# Patient Record
Sex: Female | Born: 1970 | Race: White | Hispanic: No | Marital: Married
Health system: Southern US, Community
[De-identification: ages and names within clinical notes are randomized; demographics above are authoritative.]

## PROBLEM LIST (undated history)

## (undated) DIAGNOSIS — E785 Hyperlipidemia, unspecified: Secondary | ICD-10-CM

## (undated) DIAGNOSIS — I10 Essential (primary) hypertension: Secondary | ICD-10-CM

## (undated) DIAGNOSIS — E119 Type 2 diabetes mellitus without complications: Secondary | ICD-10-CM

## (undated) HISTORY — DX: Type 2 diabetes mellitus without complications: E11.9

## (undated) HISTORY — DX: Hyperlipidemia, unspecified: E78.5

## (undated) HISTORY — PX: RHINOPLASTY: SUR1284

## (undated) HISTORY — DX: Essential (primary) hypertension: I10

## (undated) HISTORY — PX: APPENDECTOMY: SHX54

## (undated) HISTORY — PX: TUBAL LIGATION: SHX77

---

## 1999-09-15 ENCOUNTER — Encounter: Payer: Self-pay | Admitting: Emergency Medicine

## 1999-09-15 ENCOUNTER — Emergency Department (HOSPITAL_COMMUNITY): Admission: EM | Admit: 1999-09-15 | Discharge: 1999-09-15 | Payer: Self-pay | Admitting: Emergency Medicine

## 2001-08-23 ENCOUNTER — Encounter: Payer: Self-pay | Admitting: Family Medicine

## 2001-08-23 ENCOUNTER — Encounter: Admission: RE | Admit: 2001-08-23 | Discharge: 2001-08-23 | Payer: Self-pay | Admitting: Family Medicine

## 2001-11-22 ENCOUNTER — Encounter: Payer: Self-pay | Admitting: Family Medicine

## 2001-11-22 ENCOUNTER — Ambulatory Visit (HOSPITAL_COMMUNITY): Admission: RE | Admit: 2001-11-22 | Discharge: 2001-11-22 | Payer: Self-pay | Admitting: Family Medicine

## 2008-09-23 ENCOUNTER — Encounter (INDEPENDENT_AMBULATORY_CARE_PROVIDER_SITE_OTHER): Payer: Self-pay | Admitting: General Surgery

## 2008-09-23 ENCOUNTER — Ambulatory Visit (HOSPITAL_COMMUNITY): Admission: EM | Admit: 2008-09-23 | Discharge: 2008-09-24 | Payer: Self-pay | Admitting: Emergency Medicine

## 2011-03-30 NOTE — Op Note (Signed)
NAMEJAYME, MEDNICK            ACCOUNT NO.:  [id number]   MEDICAL RECORD NO.:  [id number]          PATIENT TYPE:  INP   LOCATION:  0098                         FACILITY:  [facility]   PHYSICIAN:  Almond Lint, MD       DATE OF BIRTH:  [birth year]   DATE OF PROCEDURE:  09/23/2008  DATE OF DISCHARGE:                               OPERATIVE REPORT   PREOPERATIVE DIAGNOSIS:  Appendicitis.   POSTOPERATIVE DIAGNOSIS:  Appendicitis.   PROCEDURE PERFORMED:  Laparoscopic appendectomy.   SURGEON:  Almond Lint, MD.   ANESTHESIA:  General and local.   FINDINGS:  Acute suppurative appendicitis, and a large uterine fibroid  that was pedunculated off the pelvis and uterus.   SPECIMENS:  Appendix to Pathology.   ESTIMATED BLOOD LOSS:  Less than 25 mL.   COMPLICATIONS:  None known.   PROCEDURE:  Ms. Saxby was identified in the holding area and taken to  the operating room where she was placed supine on the operating room  table.  General endotracheal anesthesia was induced.  Her left arm was  tucked, a Foley was placed, and her abdomen was prepped and draped in a  sterile fashion.  A timeout was performed according to the Surgical  Safety Check List, and when all was correct we commenced to the  procedure.   The supraumbilical skin was anesthetized with local anesthesia and a  midline incision was made just below the umbilicus approximately 1.2 cm  in length.  A Tresa Endo was used to spread the subcutaneous tissue, and  Kocher clamps were used to elevate the fascia on both sides of the  midline.  The midline fascia was entered with an 11 blade as well, and a  Kelly used to confirm entrance into the abdominal cavity.  A 0 Vicryl  pursestring suture was placed around the fascial incision.  The [employer]  trocar was introduced into the abdomen and a pneumoperitoneum was  achieved to a pressure of 15 mmHg.  The 10-mm camera was advanced into  the abdomen, and the patient was placed into  Trendelenburg and rotated  to the left.   The appendix was seen just lateral to the cecum and did indeed appear to  be inflamed.  A port was placed in the left lower quadrant after  administration of local anesthesia under direct visualization.  An  additional port was placed in the right upper quadrant under direct  visualization after administration of local anesthesia.  The appendix  was grasped with a locking grasper and elevated, and an harmonic scalpel  was used to divide the attachments to the retroperitoneum as well as the  mesoappendix.  Once this was completely dissected down and the base of  the appendix was skeletonized, the 10-mm scope was switched out to a 5-  mm scope, and the Endo-GIA was clamped about the base of the appendix.  This was done with good visualization of both tips, taking care not to  injure the small bowel or the cecum.  This was fired and held for 12  seconds and then transected.  The appendix was then placed  into an  EndoCatch bag and removed from the abdomen.   We switched back to the 10-mm scope, and the right lower quadrant and  pelvis were irrigated with the suction irrigator.  There was no bleeding  seen and no purulent drainage.  There was no sign of any damage to the  cecum or the terminal ileum.  The uterus was noted to have a very large  pedunculated fibroid, and a picture was taken of it.  The tubes and the  ovaries appeared normal, and pictures were taken of these as well.  The  pneumoperitoneum was then allowed to evacuate, and the 0 Vicryl  pursestring suture was closed.  This was palpated and there were no  palpable fascial defects.  The skin was closed using 4-0 Monocryl in a  subcuticular fashion, and the skin was cleaned, dried, and dressed with  Dermabond.  The patient was awakened from anesthesia and taken to the  PACU in stable condition.  Needle and sponge counts were correct times  two.      Almond Lint, MD  Electronically  Signed     FB/MEDQ  D:  09/23/2008  T:  09/23/2008  Job:  [id number]

## 2011-03-30 NOTE — H&P (Signed)
[name], Morgan Sims            ACCOUNT NO.:  [id number]   MEDICAL RECORD NO.:  [id number]          PATIENT TYPE:  EMS   LOCATION:  ED                           FACILITY:  [facility]   PHYSICIAN:  Almond Lint, MD       DATE OF BIRTH:  01-26-1971   DATE OF ADMISSION:  09/23/2008  DATE OF DISCHARGE:                              HISTORY & PHYSICAL   REFERRING PHYSICIAN:  Robert A. Nicholos Johns, M.D. of [facility] .   CHIEF COMPLAINT:  Abdominal pain.   HISTORY OF PRESENT ILLNESS:  [name] is a 40 year old female who  around 4 days ago began having abdominal discomfort.  She also developed  diarrhea and nausea and a decreased appetite.  This did not improve, and  this morning she had pain that awoke her from sleep at 4 a.m.  This is  now principally on the right side.  She has described a feeling of  clamminess but no fever.   PAST MEDICAL HISTORY:  1. Bipolar disorder.  2. History of asthma, for which she uses an inhaler approximately one      time a year.   PAST SURGICAL HISTORY:  1. Tonsillectomy and adenoidectomy.  2. Tubal ligation, laparoscopically.  3. Rhinoplasty.   FAMILY HISTORY:  CVA in her mother and a family history of hypertension.   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  Smokes five cigarettes a day.  Does not drink alcohol.   MEDICATIONS:  1. Lithium.  2. Seroquel.  3. Wellbutrin.  4. Lamictal.  5. Prozac.   ALLERGIES:  KEFLEX causes her face to swell.   PHYSICAL EXAMINATION:  Temperature 98.5, pulse 71, respiratory rate 18,  blood pressure 125/91.  Alert and oriented x3.  No acute distress.  HEENT:  Normocephalic and atraumatic.  Sclerae are anicteric.  Pupils  are equal and reactive to light.  NECK:  Supple.  HEART:  Regular.  LUNGS:  Clear.  ABDOMEN:  Soft, nondistended.  Very tender in the right lower quadrant.  Positive Rovsing's sign.  EXTREMITIES:  Warm and well perfused.  No edema.  SKIN:  No rashes  seen.  PSYCH:  Normal mood and affect.   LABS:  White count is 11.4, hematocrit 38.9, platelet count 318,000.   CT scan demonstrates acute appendicitis.  This is discussed directly  with Dr. Clovis Riley at [facility].  There are no other  findings present.   ASSESSMENT:  Acute appendicitis.   PLAN:  Laparoscopic appendectomy.  IV fluids.  N.P.O.  IV antibiotics.  The patient was advised of the risks and benefits for surgery.  We  discussed bleeding, infection, damage to other structures, risks of  conversion to open, and risks of having a ruptured appendix which would  require a longer inpatient stay, and higher risk of intraabdominal  abscess.  She agreed and wishes to proceed.      Almond Lint, MD  Electronically Signed     FB/MEDQ  D:  09/23/2008  T:  09/23/2008  Job:  [id number]

## 2011-08-17 LAB — DIFFERENTIAL
Basophils Absolute: 0
Basophils Relative: 0
Eosinophils Absolute: 0
Eosinophils Relative: 0
Lymphocytes Relative: 11 — ABNORMAL LOW
Lymphs Abs: 1.6
Monocytes Absolute: 0.8
Monocytes Relative: 5
Neutro Abs: 12.2 — ABNORMAL HIGH
Neutrophils Relative %: 83 — ABNORMAL HIGH

## 2011-08-17 LAB — URINALYSIS, ROUTINE W REFLEX MICROSCOPIC
Ketones, ur: NEGATIVE
Nitrite: NEGATIVE
Protein, ur: NEGATIVE
Urobilinogen, UA: 0.2

## 2011-08-17 LAB — BASIC METABOLIC PANEL
BUN: 8
CO2: 25
Calcium: 9.7
Chloride: 107
Creatinine, Ser: 0.76
GFR calc Af Amer: >60
GFR calc non Af Amer: >60
Glucose, Bld: 133 — ABNORMAL HIGH
Potassium: 3.7
Sodium: 140

## 2011-08-17 LAB — CBC
HCT: 39.7
Hemoglobin: 13.5
MCHC: 34
MCV: 85
Platelets: 361
RBC: 4.67
RDW: 14.1
WBC: 14.6 — ABNORMAL HIGH

## 2012-10-26 ENCOUNTER — Other Ambulatory Visit: Payer: Self-pay | Admitting: Family Medicine

## 2012-10-26 DIAGNOSIS — R1011 Right upper quadrant pain: Secondary | ICD-10-CM

## 2012-10-27 ENCOUNTER — Ambulatory Visit
Admission: RE | Admit: 2012-10-27 | Discharge: 2012-10-27 | Disposition: A | Discharge status: Home / Self Care | Payer: BC Managed Care – PPO | Source: Ambulatory Visit | Attending: Family Medicine | Admitting: Family Medicine

## 2012-10-27 DIAGNOSIS — R1011 Right upper quadrant pain: Secondary | ICD-10-CM

## 2012-10-31 ENCOUNTER — Other Ambulatory Visit (HOSPITAL_COMMUNITY): Payer: Self-pay | Admitting: Family Medicine

## 2012-10-31 DIAGNOSIS — R109 Unspecified abdominal pain: Secondary | ICD-10-CM

## 2012-10-31 DIAGNOSIS — R1011 Right upper quadrant pain: Secondary | ICD-10-CM

## 2012-11-16 ENCOUNTER — Encounter (HOSPITAL_COMMUNITY): Payer: BC Managed Care – PPO

## 2015-06-17 ENCOUNTER — Other Ambulatory Visit: Payer: Self-pay

## 2015-06-17 DIAGNOSIS — Z1231 Encounter for screening mammogram for malignant neoplasm of breast: Secondary | ICD-10-CM

## 2015-06-18 ENCOUNTER — Ambulatory Visit
Admission: RE | Admit: 2015-06-18 | Discharge: 2015-06-18 | Disposition: A | Discharge status: Home / Self Care | Payer: BC Managed Care – PPO | Source: Ambulatory Visit

## 2015-06-18 DIAGNOSIS — Z1231 Encounter for screening mammogram for malignant neoplasm of breast: Secondary | ICD-10-CM

## 2015-06-19 ENCOUNTER — Other Ambulatory Visit: Payer: Self-pay | Admitting: Family Medicine

## 2015-06-19 DIAGNOSIS — R928 Other abnormal and inconclusive findings on diagnostic imaging of breast: Secondary | ICD-10-CM

## 2015-06-27 ENCOUNTER — Other Ambulatory Visit: Payer: BC Managed Care – PPO

## 2015-06-30 ENCOUNTER — Ambulatory Visit
Admission: RE | Admit: 2015-06-30 | Discharge: 2015-06-30 | Disposition: A | Discharge status: Home / Self Care | Payer: BC Managed Care – PPO | Source: Ambulatory Visit | Attending: Family Medicine | Admitting: Family Medicine

## 2015-06-30 DIAGNOSIS — R928 Other abnormal and inconclusive findings on diagnostic imaging of breast: Secondary | ICD-10-CM

## 2016-03-31 ENCOUNTER — Other Ambulatory Visit: Payer: Self-pay | Admitting: Family Medicine

## 2016-03-31 DIAGNOSIS — N6489 Other specified disorders of breast: Secondary | ICD-10-CM

## 2016-04-07 ENCOUNTER — Other Ambulatory Visit: Payer: BC Managed Care – PPO

## 2016-05-21 ENCOUNTER — Other Ambulatory Visit: Payer: BC Managed Care – PPO

## 2016-06-21 ENCOUNTER — Other Ambulatory Visit: Payer: BC Managed Care – PPO

## 2016-06-22 IMAGING — MG MM DIAG BREAST TOMO UNI LEFT
8 series · 8 of 24 positions shown · non-contrast
Comparison: Previous mammogram dated 06/18/2015.

CLINICAL DATA: Patient returns today to evaluate a possible left
breast asymmetry identified on recent screening mammogram.

EXAM:
DIGITAL DIAGNOSTIC LEFT MAMMOGRAM WITH 3D TOMOSYNTHESIS WITH CAD
ULTRASOUND LEFT BREAST

[L MLO (1 of 2)]
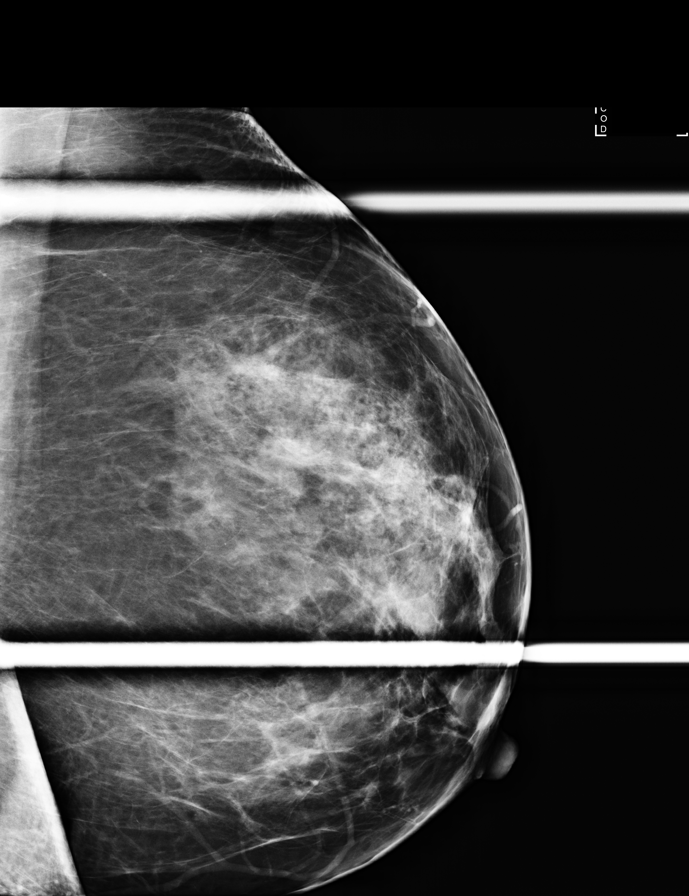

[L CC (1 of 2)]
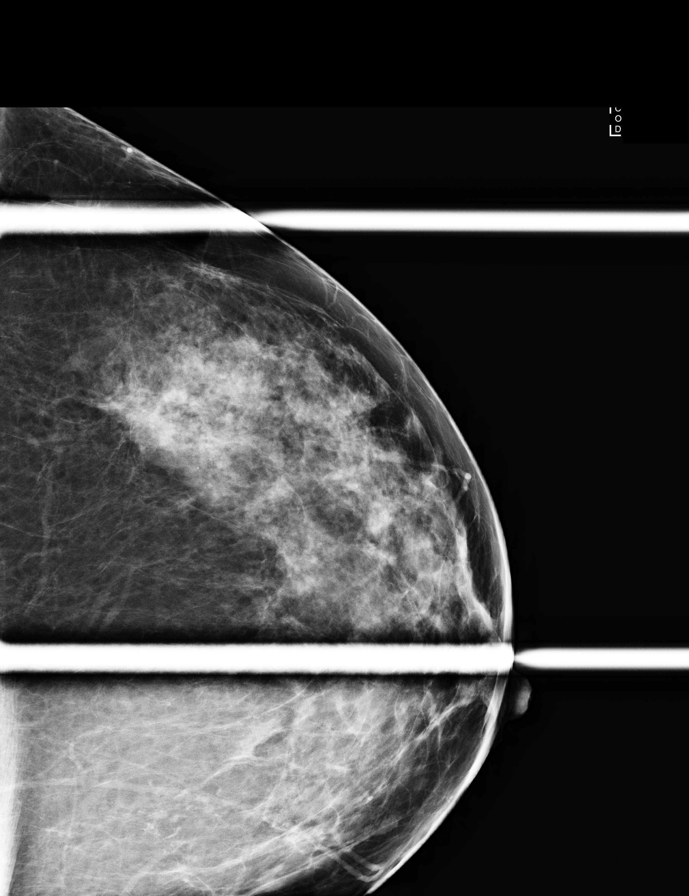

[L CC (2 of 2)]
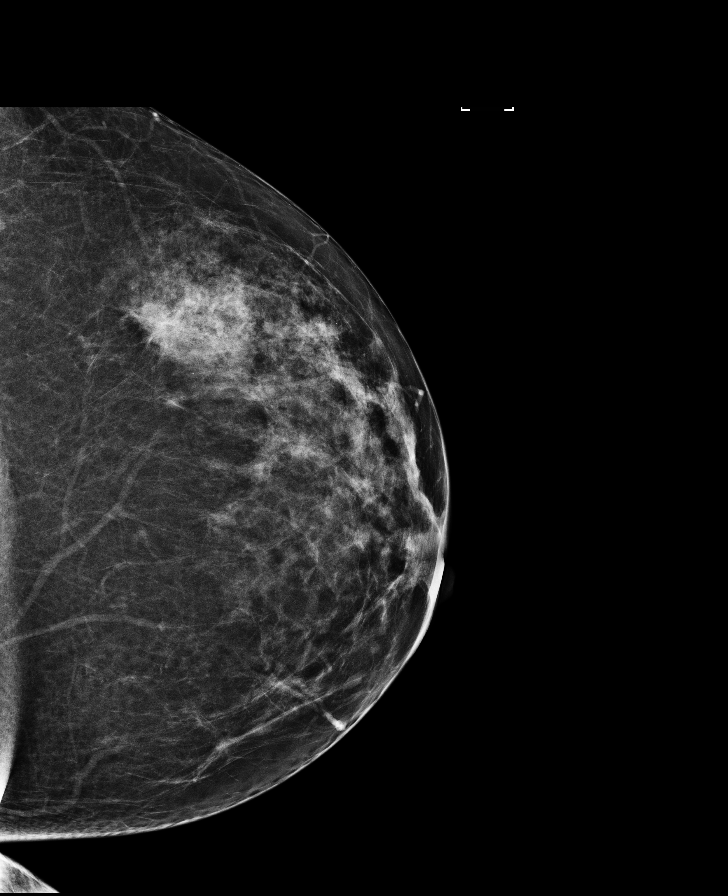

[L MLO (2 of 2)]
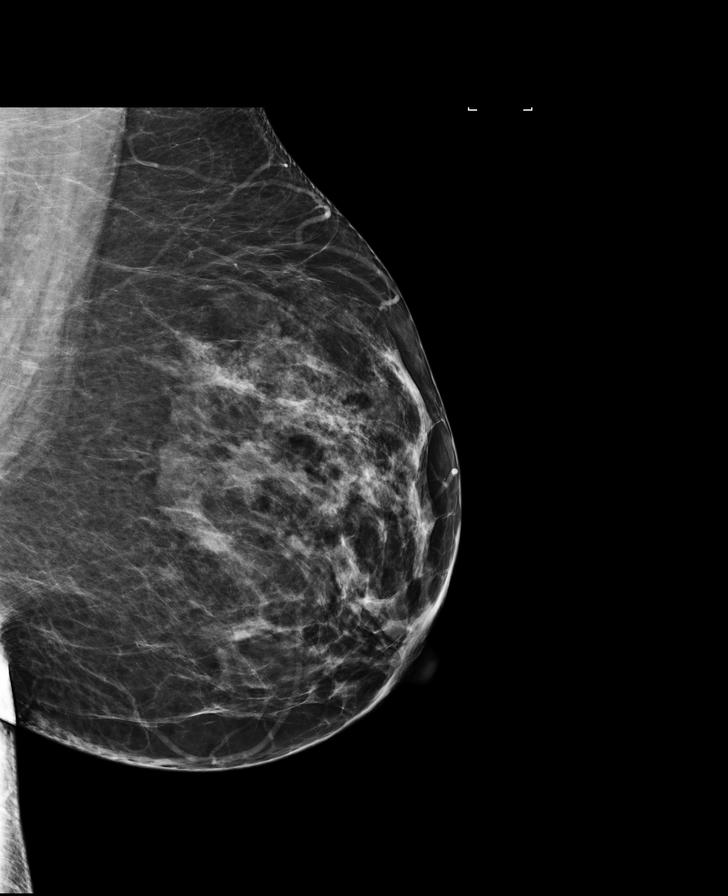

[L CC tomo (1 of 2) · tomo slice 36/71.0]
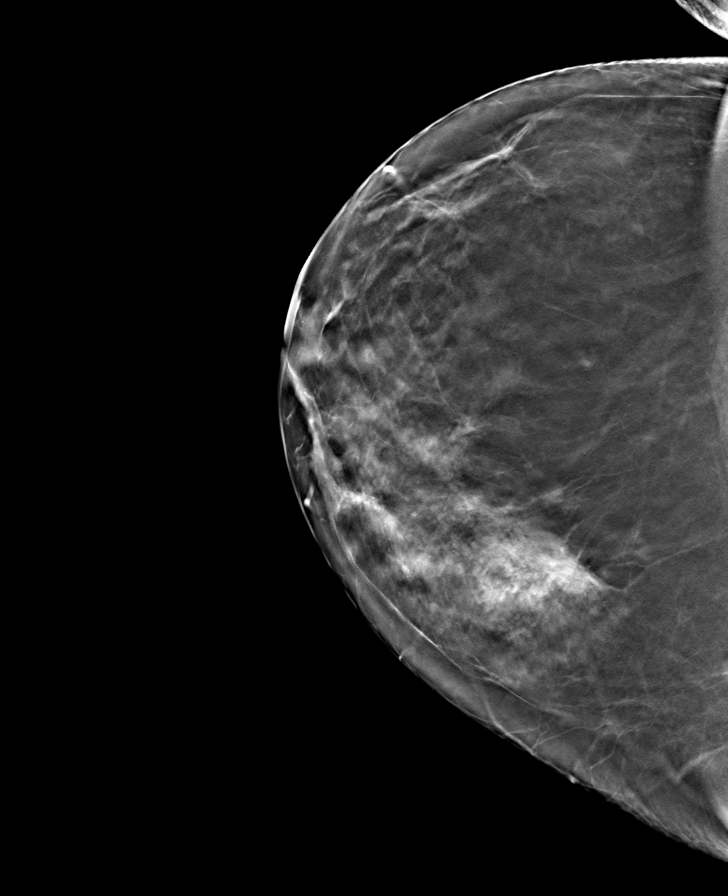

[L MLO tomo (1 of 2) · tomo slice 45/88.0]
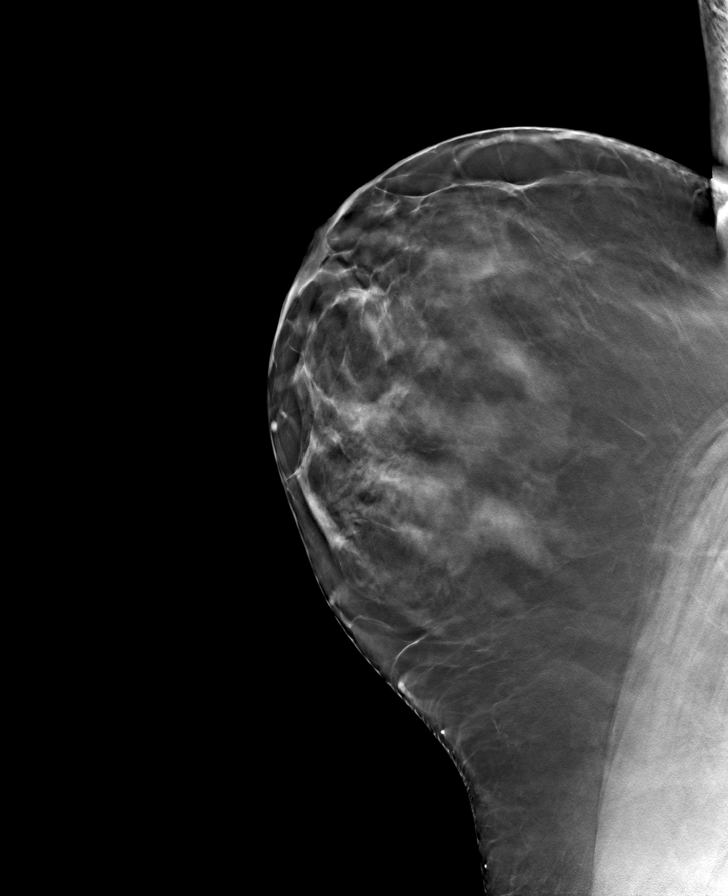

[L CC tomo (2 of 2) · tomo slice 35/69.0]
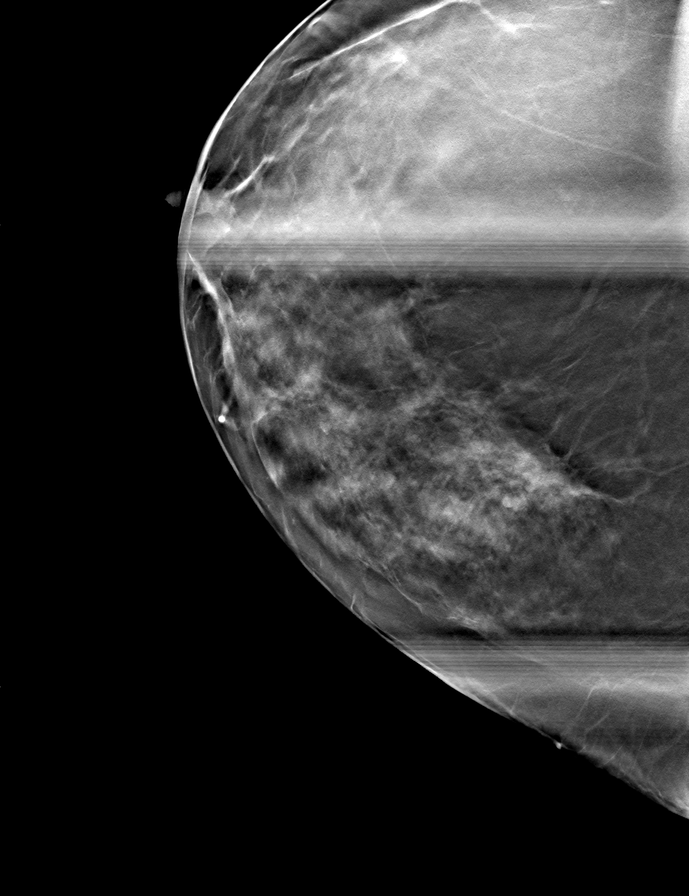

[L MLO tomo (2 of 2) · tomo slice 44/87.0]
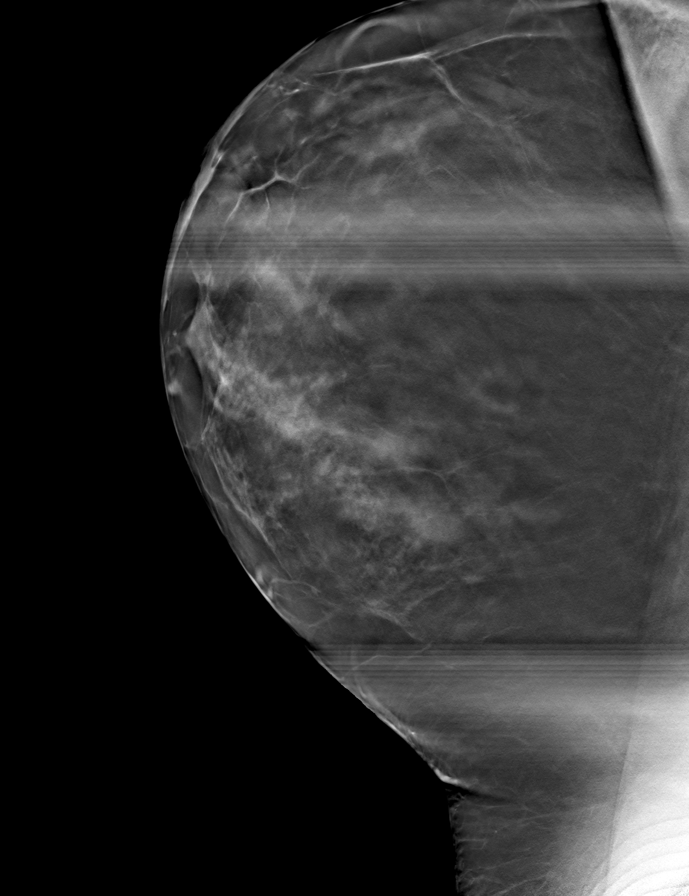

[8 of 24 positions shown; findings below may reference images not displayed]

There are no are
earlier mammograms.

ACR Breast Density Category c: The breast tissue is heterogeneously
dense, which may obscure small masses.
FINDINGS: On today's additional views, there is a persistent focal asymmetry
within the upper-outer quadrant of the left breast, at middle to
posterior depth, most suggestive of superimposition of normal dense
fibroglandular tissues on the spot compression tomosynthesis images.
No dominant masses or suspicious calcifications are identified in
the left breast.

Mammographic images were processed with CAD.

On physical exam, no palpable abnormality identified within the
upper-outer quadrant of the left breast.

Targeted ultrasound is performed, showing an oval circumscribed mass
within the left breast at the 2 o'clock axis, 8 cm from the nipple,
measuring 0.7 x 0.4 x 0.5 cm, most suggestive of a benign fat lobule
during real-time ultrasound evaluation, and likely corresponding as
an incidental finding.

There are additional small cysts scattered within the upper-outer
quadrant of the left breast. There is also a probably benign cluster
of microcysts at the 2 o'clock axis, 2 cm from the nipple, measuring
1 x 0.4 x 0.6 cm, which also correlate as an incidental finding.
IMPRESSION: 1. Probably benign focal asymmetry within the upper-outer quadrant
of the left breast, at middle to posterior depth, most suggestive of
superimposition of normal dense fibroglandular tissues on spot
compression tomosynthesis. As the recent screening mammogram was a
baseline exam, recommend six-month follow-up left breast diagnostic
mammogram, with spot compression views, to ensure stability.
2. Probably benign cluster of microcysts within the left breast at
the 2 o'clock axis, 2 cm from the nipple, measuring 1 x 0.4 x
cm, without mammographic correlate. Recommend six-month follow-up
left breast diagnostic ultrasound to ensure stability. Also
recommend attention to the probably benign fat lobule within the
left breast at the 2 o'clock axis, 8 cm from the nipple, measuring
0.7 x 0.4 x 0.5 cm.

RECOMMENDATION:
Follow-up left breast diagnostic mammogram and ultrasound in 6
months.

I have discussed the findings and recommendations with the patient.
Results were also provided in writing at the conclusion of the
visit. If applicable, a reminder letter will be sent to the patient
regarding the next appointment.

BI-RADS CATEGORY  3: Probably benign.

## 2016-06-29 ENCOUNTER — Ambulatory Visit
Admission: RE | Admit: 2016-06-29 | Discharge: 2016-06-29 | Disposition: A | Discharge status: Home / Self Care | Payer: BC Managed Care – PPO | Source: Ambulatory Visit | Attending: Family Medicine | Admitting: Family Medicine

## 2016-06-29 DIAGNOSIS — N6489 Other specified disorders of breast: Secondary | ICD-10-CM

## 2016-10-14 ENCOUNTER — Other Ambulatory Visit: Payer: Self-pay | Admitting: Physician Assistant

## 2016-10-14 DIAGNOSIS — R1011 Right upper quadrant pain: Secondary | ICD-10-CM

## 2016-11-04 ENCOUNTER — Ambulatory Visit
Admission: RE | Admit: 2016-11-04 | Discharge: 2016-11-04 | Disposition: A | Discharge status: Home / Self Care | Payer: BC Managed Care – PPO | Source: Ambulatory Visit | Attending: Physician Assistant | Admitting: Physician Assistant

## 2016-11-04 DIAGNOSIS — R1011 Right upper quadrant pain: Secondary | ICD-10-CM

## 2016-11-24 ENCOUNTER — Other Ambulatory Visit (HOSPITAL_COMMUNITY): Payer: Self-pay | Admitting: Physician Assistant

## 2016-11-24 DIAGNOSIS — R1011 Right upper quadrant pain: Secondary | ICD-10-CM

## 2016-12-01 ENCOUNTER — Encounter (HOSPITAL_COMMUNITY): Payer: BC Managed Care – PPO

## 2017-01-03 ENCOUNTER — Encounter (HOSPITAL_COMMUNITY): Admission: RE | Admit: 2017-01-03 | Payer: BC Managed Care – PPO | Source: Ambulatory Visit

## 2017-04-05 ENCOUNTER — Other Ambulatory Visit: Payer: Self-pay | Admitting: Family Medicine

## 2017-04-05 DIAGNOSIS — N6489 Other specified disorders of breast: Secondary | ICD-10-CM

## 2017-04-21 ENCOUNTER — Other Ambulatory Visit: Payer: BC Managed Care – PPO

## 2017-04-28 ENCOUNTER — Other Ambulatory Visit: Payer: Self-pay | Admitting: Family Medicine

## 2017-04-28 ENCOUNTER — Ambulatory Visit
Admission: RE | Admit: 2017-04-28 | Discharge: 2017-04-28 | Disposition: A | Discharge status: Home / Self Care | Payer: BC Managed Care – PPO | Source: Ambulatory Visit | Attending: Family Medicine | Admitting: Family Medicine

## 2017-04-28 DIAGNOSIS — N6489 Other specified disorders of breast: Secondary | ICD-10-CM

## 2017-04-28 DIAGNOSIS — R921 Mammographic calcification found on diagnostic imaging of breast: Secondary | ICD-10-CM

## 2017-05-03 ENCOUNTER — Ambulatory Visit
Admission: RE | Admit: 2017-05-03 | Discharge: 2017-05-03 | Disposition: A | Discharge status: Home / Self Care | Payer: BC Managed Care – PPO | Source: Ambulatory Visit | Attending: Family Medicine | Admitting: Family Medicine

## 2017-05-03 DIAGNOSIS — R921 Mammographic calcification found on diagnostic imaging of breast: Secondary | ICD-10-CM

## 2017-05-03 DIAGNOSIS — N6489 Other specified disorders of breast: Secondary | ICD-10-CM

## 2018-05-10 ENCOUNTER — Encounter: Payer: BC Managed Care – PPO | Attending: Endocrinology | Admitting: Skilled Nursing Facility1

## 2018-05-10 ENCOUNTER — Encounter: Payer: Self-pay | Admitting: Skilled Nursing Facility1

## 2018-05-10 DIAGNOSIS — E1165 Type 2 diabetes mellitus with hyperglycemia: Secondary | ICD-10-CM | POA: Diagnosis not present

## 2018-05-10 DIAGNOSIS — E119 Type 2 diabetes mellitus without complications: Secondary | ICD-10-CM

## 2018-05-10 DIAGNOSIS — Z6836 Body mass index (BMI) 36.0-36.9, adult: Secondary | ICD-10-CM | POA: Diagnosis not present

## 2018-05-10 DIAGNOSIS — Z713 Dietary counseling and surveillance: Secondary | ICD-10-CM | POA: Diagnosis present

## 2018-05-10 NOTE — Progress Notes (Signed)
Pt states she does not check her blood sugar stating she cannot remember to check. Pt does not know her A1C and that information is not in the EMR.  Pt states she is sweaty and menopausal all the time.  Pt states when her blood sugar is around 120-160 she feels sick. Pt states she constantly has dry heels.  When looking in her phone at her labs she realized she has had diabetes longer than she thought: years longer than she thought.  Pt states she feels thirst frequently. Pt states she used to smoke for 30 years.  Pt reports sleep eating: being asleep and waking while eating: with awareness.: as eating falling back to sleep.  Pt reports an unhealthy relationship with food and a childhood leading to that: spoken tearfully.   Goals: -Check your blood sugar every day while your coffee is brewing and write it down -Start to become aware of when you are eating and why you are eating -Take your dog for a walk 3 days a week  -Balanced meals; write down meals  Diabetes Self-Management Education  Visit Type: First/Initial  05/10/2018  [name], identified by name and date of birth, is a 47 y.o. female with a diagnosis of Diabetes: Type 2.   ASSESSMENT  Height 5' 4.5" (1.638 m), weight 217 lb 12.8 oz (98.8 kg). Body mass index is 36.81 kg/m.  Diabetes Self-Management Education - 05/10/18 1028      Visit Information   Visit Type  First/Initial      Initial Visit   Diabetes Type  Type 2    Are you currently following a meal plan?  No    Are you taking your medications as prescribed?  Yes      Health Coping   How would you rate your overall health?  Fair      Psychosocial Assessment   Patient Belief/Attitude about Diabetes  Motivated to manage diabetes      Pre-Education Assessment   Patient understands the diabetes disease and treatment process.  Needs Instruction    Patient understands incorporating nutritional management into lifestyle.  Needs Instruction    Patient  undertands incorporating physical activity into lifestyle.  Needs Instruction    Patient understands using medications safely.  Needs Instruction    Patient understands monitoring blood glucose, interpreting and using results  Needs Instruction    Patient understands prevention, detection, and treatment of acute complications.  Needs Instruction    Patient understands prevention, detection, and treatment of chronic complications.  Needs Instruction    Patient understands how to develop strategies to address psychosocial issues.  Needs Instruction    Patient understands how to develop strategies to promote health/change behavior.  Needs Instruction      Complications   How often do you check your blood sugar?  0 times/day (not testing)    Have you had a dilated eye exam in the past 12 months?  Yes    Have you had a dental exam in the past 12 months?  Yes    Are you checking your feet?  Yes    How many days per week are you checking your feet?  7      Dietary Intake   Breakfast  skipped with coffee with creamer and suagr (3 mugs)    Lunch  11:30-2:30: peanut butter crackers or peanut butter sandwich or salad    Dinner  chicken from [employer] and kale salad or breaded boneless wings  Snack (evening)  waking in the middle of the night to eat sherbert and cereal    Beverage(s)  diet soda, coffee with sweetener, vitamin water zero      Exercise   Exercise Type  ADL's    How many days per week to you exercise?  0    How many minutes per day do you exercise?  0    Total minutes per week of exercise  0      Patient Education   Previous Diabetes Education  No    Disease state   Definition of diabetes, type 1 and 2, and the diagnosis of diabetes;Factors that contribute to the development of diabetes    Nutrition management   Role of diet in the treatment of diabetes and the relationship between the three main macronutrients and blood glucose level;Food label reading, portion sizes and measuring  food.;Carbohydrate counting    Physical activity and exercise   Role of exercise on diabetes management, blood pressure control and cardiac health.    Monitoring  Purpose and frequency of SMBG.;Taught/evaluated SMBG meter.;Yearly dilated eye exam;Daily foot exams    Chronic complications  Relationship between chronic complications and blood glucose control;Lipid levels, blood glucose control and heart disease;Assessed and discussed foot care and prevention of foot problems;Retinopathy and reason for yearly dilated eye exams;Reviewed with patient heart disease, higher risk of, and prevention    Psychosocial adjustment  Role of stress on diabetes;Worked with patient to identify barriers to care and solutions    Personal strategies to promote health  Lifestyle issues that need to be addressed for better diabetes care      Individualized Goals (developed by patient)   Nutrition  Follow meal plan discussed    Physical Activity  Exercise 3-5 times per week;30 minutes per day    Monitoring   test my blood glucose as discussed;test blood glucose pre and post meals as discussed      Post-Education Assessment   Patient understands the diabetes disease and treatment process.  Demonstrates understanding / competency    Patient understands incorporating nutritional management into lifestyle.  Demonstrates understanding / competency    Patient undertands incorporating physical activity into lifestyle.  Demonstrates understanding / competency    Patient understands using medications safely.  Demonstrates understanding / competency    Patient understands monitoring blood glucose, interpreting and using results  Demonstrates understanding / competency    Patient understands prevention, detection, and treatment of acute complications.  Demonstrates understanding / competency    Patient understands prevention, detection, and treatment of chronic complications.  Demonstrates understanding / competency    Patient  understands how to develop strategies to address psychosocial issues.  Demonstrates understanding / competency    Patient understands how to develop strategies to promote health/change behavior.  Demonstrates understanding / competency      Outcomes   Expected Outcomes  Demonstrated interest in learning. Expect positive outcomes    Future DMSE  4-6 wks    Program Status  Completed       Individualized Plan for Diabetes Self-Management Training:   Learning Objective:  Patient will have a greater understanding of diabetes self-management. Patient education plan is to attend individual and/or group sessions per assessed needs and concerns.   Plan:   Patient Instructions  [email or web address]  [facility] [phone])      Expected Outcomes:  Demonstrated interest in learning. Expect positive outcomes  Education material provided: ADA Diabetes: Your Take Control Guide, A1C conversion sheet, Meal plan card  and Support group flyer  If problems or questions, patient to contact team via:  Phone  Future DSME appointment: 4-6 wks

## 2018-05-10 NOTE — Patient Instructions (Signed)
[email or web address]  [facility] ([phone])  

## 2018-06-08 ENCOUNTER — Ambulatory Visit: Payer: BC Managed Care – PPO | Admitting: Skilled Nursing Facility1

## 2019-11-12 ENCOUNTER — Other Ambulatory Visit: Payer: Self-pay | Admitting: Family Medicine

## 2019-11-12 DIAGNOSIS — M7989 Other specified soft tissue disorders: Secondary | ICD-10-CM

## 2019-11-23 ENCOUNTER — Other Ambulatory Visit: Payer: BC Managed Care – PPO
